# Patient Record
Sex: Male | Born: 1975 | Race: White | Hispanic: No | Marital: Single | Smoking: Never smoker
Health system: Southern US, Community
[De-identification: ages and names within clinical notes are randomized; demographics above are authoritative.]

## PROBLEM LIST (undated history)

## (undated) DIAGNOSIS — F1021 Alcohol dependence, in remission: Secondary | ICD-10-CM

## (undated) DIAGNOSIS — A498 Other bacterial infections of unspecified site: Secondary | ICD-10-CM

## (undated) DIAGNOSIS — K219 Gastro-esophageal reflux disease without esophagitis: Secondary | ICD-10-CM

## (undated) DIAGNOSIS — I10 Essential (primary) hypertension: Secondary | ICD-10-CM

## (undated) DIAGNOSIS — K644 Residual hemorrhoidal skin tags: Secondary | ICD-10-CM

## (undated) DIAGNOSIS — K769 Liver disease, unspecified: Secondary | ICD-10-CM

## (undated) DIAGNOSIS — E785 Hyperlipidemia, unspecified: Secondary | ICD-10-CM

## (undated) DIAGNOSIS — K449 Diaphragmatic hernia without obstruction or gangrene: Secondary | ICD-10-CM

## (undated) DIAGNOSIS — K760 Fatty (change of) liver, not elsewhere classified: Secondary | ICD-10-CM

## (undated) DIAGNOSIS — K2270 Barrett's esophagus without dysplasia: Secondary | ICD-10-CM

## (undated) DIAGNOSIS — K5730 Diverticulosis of large intestine without perforation or abscess without bleeding: Secondary | ICD-10-CM

## (undated) HISTORY — DX: Barrett's esophagus without dysplasia: K22.70

## (undated) HISTORY — DX: Diaphragmatic hernia without obstruction or gangrene: K44.9

## (undated) HISTORY — DX: Gastro-esophageal reflux disease without esophagitis: K21.9

## (undated) HISTORY — DX: Diverticulosis of large intestine without perforation or abscess without bleeding: K57.30

## (undated) HISTORY — DX: Fatty (change of) liver, not elsewhere classified: K76.0

## (undated) HISTORY — DX: Residual hemorrhoidal skin tags: K64.4

---

## 2005-01-29 ENCOUNTER — Ambulatory Visit: Payer: Self-pay | Admitting: Gastroenterology

## 2006-01-06 ENCOUNTER — Encounter: Admission: RE | Admit: 2006-01-06 | Discharge: 2006-01-06 | Payer: Self-pay | Admitting: Family Medicine

## 2007-08-23 ENCOUNTER — Ambulatory Visit: Payer: Self-pay | Admitting: Gastroenterology

## 2007-08-23 DIAGNOSIS — K7689 Other specified diseases of liver: Secondary | ICD-10-CM

## 2007-08-23 DIAGNOSIS — E669 Obesity, unspecified: Secondary | ICD-10-CM | POA: Insufficient documentation

## 2007-08-23 DIAGNOSIS — K2270 Barrett's esophagus without dysplasia: Secondary | ICD-10-CM

## 2007-08-23 DIAGNOSIS — K219 Gastro-esophageal reflux disease without esophagitis: Secondary | ICD-10-CM | POA: Insufficient documentation

## 2008-10-29 ENCOUNTER — Telehealth: Payer: Self-pay | Admitting: Gastroenterology

## 2010-08-14 ENCOUNTER — Ambulatory Visit: Payer: Self-pay | Admitting: Gastroenterology

## 2014-02-23 ENCOUNTER — Other Ambulatory Visit (HOSPITAL_BASED_OUTPATIENT_CLINIC_OR_DEPARTMENT_OTHER): Payer: Self-pay | Admitting: Osteopathic Medicine

## 2014-02-23 DIAGNOSIS — R51 Headache: Principal | ICD-10-CM

## 2014-02-23 DIAGNOSIS — R519 Headache, unspecified: Secondary | ICD-10-CM

## 2014-02-23 DIAGNOSIS — H579 Unspecified disorder of eye and adnexa: Secondary | ICD-10-CM

## 2014-02-24 ENCOUNTER — Ambulatory Visit (HOSPITAL_BASED_OUTPATIENT_CLINIC_OR_DEPARTMENT_OTHER): Payer: 59

## 2014-11-02 ENCOUNTER — Encounter: Payer: Self-pay | Admitting: Gastroenterology

## 2015-07-31 ENCOUNTER — Emergency Department (HOSPITAL_BASED_OUTPATIENT_CLINIC_OR_DEPARTMENT_OTHER)
Admission: EM | Admit: 2015-07-31 | Discharge: 2015-07-31 | Disposition: A | Discharge status: Home / Self Care | Payer: 59 | Attending: Emergency Medicine | Admitting: Emergency Medicine

## 2015-07-31 ENCOUNTER — Emergency Department (HOSPITAL_BASED_OUTPATIENT_CLINIC_OR_DEPARTMENT_OTHER): Payer: 59

## 2015-07-31 ENCOUNTER — Encounter (HOSPITAL_BASED_OUTPATIENT_CLINIC_OR_DEPARTMENT_OTHER): Payer: Self-pay | Admitting: *Deleted

## 2015-07-31 DIAGNOSIS — R079 Chest pain, unspecified: Secondary | ICD-10-CM | POA: Diagnosis not present

## 2015-07-31 DIAGNOSIS — R202 Paresthesia of skin: Secondary | ICD-10-CM | POA: Insufficient documentation

## 2015-07-31 DIAGNOSIS — R0602 Shortness of breath: Secondary | ICD-10-CM | POA: Diagnosis not present

## 2015-07-31 DIAGNOSIS — R160 Hepatomegaly, not elsewhere classified: Secondary | ICD-10-CM | POA: Insufficient documentation

## 2015-07-31 DIAGNOSIS — R251 Tremor, unspecified: Secondary | ICD-10-CM | POA: Insufficient documentation

## 2015-07-31 DIAGNOSIS — R002 Palpitations: Secondary | ICD-10-CM | POA: Diagnosis not present

## 2015-07-31 DIAGNOSIS — K460 Unspecified abdominal hernia with obstruction, without gangrene: Secondary | ICD-10-CM | POA: Insufficient documentation

## 2015-07-31 DIAGNOSIS — R112 Nausea with vomiting, unspecified: Secondary | ICD-10-CM | POA: Diagnosis present

## 2015-07-31 DIAGNOSIS — R000 Tachycardia, unspecified: Secondary | ICD-10-CM | POA: Diagnosis not present

## 2015-07-31 DIAGNOSIS — R1111 Vomiting without nausea: Secondary | ICD-10-CM

## 2015-07-31 LAB — COMPREHENSIVE METABOLIC PANEL
ALBUMIN: 3.5 g/dL (ref 3.5–5.0)
ALK PHOS: 98 U/L (ref 38–126)
ALT: 39 U/L (ref 17–63)
ANION GAP: 14 (ref 5–15)
AST: 156 U/L — ABNORMAL HIGH (ref 15–41)
BUN: 8 mg/dL (ref 6–20)
CALCIUM: 8.1 mg/dL — AB (ref 8.9–10.3)
CHLORIDE: 98 mmol/L — AB (ref 101–111)
CO2: 26 mmol/L (ref 22–32)
Creatinine, Ser: 0.78 mg/dL (ref 0.61–1.24)
GFR calc non Af Amer: >60 mL/min (ref >60)
GLUCOSE: 130 mg/dL — AB (ref 65–99)
POTASSIUM: 3.6 mmol/L (ref 3.5–5.1)
SODIUM: 138 mmol/L (ref 135–145)
Total Bilirubin: 4 mg/dL — ABNORMAL HIGH (ref 0.3–1.2)
Total Protein: 9.3 g/dL — ABNORMAL HIGH (ref 6.5–8.1)

## 2015-07-31 LAB — CBC WITH DIFFERENTIAL/PLATELET
BASOS PCT: 0 %
Basophils Absolute: 0 10*3/uL (ref 0.0–0.1)
EOS ABS: 0 10*3/uL (ref 0.0–0.7)
EOS PCT: 0 %
HCT: 40.4 % (ref 39.0–52.0)
HEMOGLOBIN: 13.8 g/dL (ref 13.0–17.0)
LYMPHS ABS: 0.4 10*3/uL — AB (ref 0.7–4.0)
Lymphocytes Relative: 9 %
MCH: 33 pg (ref 26.0–34.0)
MCHC: 34.2 g/dL (ref 30.0–36.0)
MCV: 96.7 fL (ref 78.0–100.0)
MONOS PCT: 5 %
Monocytes Absolute: 0.2 10*3/uL (ref 0.1–1.0)
NEUTROS PCT: 86 %
Neutro Abs: 3.9 10*3/uL (ref 1.7–7.7)
PLATELETS: 97 10*3/uL — AB (ref 150–400)
RBC: 4.18 MIL/uL — ABNORMAL LOW (ref 4.22–5.81)
RDW: 12.6 % (ref 11.5–15.5)
WBC: 4.5 10*3/uL (ref 4.0–10.5)

## 2015-07-31 LAB — MAGNESIUM: MAGNESIUM: 0.9 mg/dL — AB (ref 1.7–2.4)

## 2015-07-31 LAB — TROPONIN I: Troponin I: <0.03 ng/mL (ref <0.031)

## 2015-07-31 MED ORDER — SODIUM CHLORIDE 0.9 % IV BOLUS (SEPSIS)
1000.0000 mL | Freq: Once | INTRAVENOUS | Status: AC
  Administered 2015-07-31: 1000 mL via INTRAVENOUS

## 2015-07-31 MED ORDER — MAGNESIUM OXIDE -MG SUPPLEMENT 500 MG PO TABS: 500.0000 mg | ORAL_TABLET | Freq: Two times a day (BID) | ORAL | Status: DC

## 2015-07-31 MED ORDER — LORAZEPAM 2 MG/ML IJ SOLN
1.0000 mg | Freq: Once | INTRAMUSCULAR | Status: AC
  Administered 2015-07-31: 1 mg via INTRAVENOUS
  Filled 2015-07-31: qty 1

## 2015-07-31 MED ORDER — IOPAMIDOL (ISOVUE-300) INJECTION 61%
100.0000 mL | Freq: Once | INTRAVENOUS | Status: AC | PRN
  Administered 2015-07-31: 100 mL via INTRAVENOUS

## 2015-07-31 MED ORDER — ONDANSETRON HCL 4 MG/2ML IJ SOLN
4.0000 mg | Freq: Once | INTRAMUSCULAR | Status: AC
  Administered 2015-07-31: 4 mg via INTRAVENOUS
  Filled 2015-07-31: qty 2

## 2015-07-31 MED ORDER — LORAZEPAM 2 MG/ML IJ SOLN
2.0000 mg | Freq: Once | INTRAMUSCULAR | Status: AC
  Administered 2015-07-31: 2 mg via INTRAVENOUS
  Filled 2015-07-31: qty 1

## 2015-07-31 MED ORDER — CHLORDIAZEPOXIDE HCL 25 MG PO CAPS
100.0000 mg | ORAL_CAPSULE | Freq: Once | ORAL | Status: AC
  Administered 2015-07-31: 100 mg via ORAL
  Filled 2015-07-31: qty 4

## 2015-07-31 MED ORDER — ONDANSETRON 4 MG PO TBDP: ORAL_TABLET | ORAL | Status: DC

## 2015-07-31 MED ORDER — MORPHINE SULFATE (PF) 4 MG/ML IV SOLN: 4.0000 mg | Freq: Once | INTRAVENOUS | Status: DC

## 2015-07-31 MED ORDER — CHLORDIAZEPOXIDE HCL 25 MG PO CAPS: ORAL_CAPSULE | ORAL | Status: DC

## 2015-07-31 MED ORDER — MAGNESIUM SULFATE 2 GM/50ML IV SOLN
2.0000 g | Freq: Once | INTRAVENOUS | Status: AC
  Administered 2015-07-31: 2 g via INTRAVENOUS
  Filled 2015-07-31: qty 50

## 2015-07-31 MED ORDER — VITAMIN B-1 100 MG PO TABS
100.0000 mg | ORAL_TABLET | Freq: Once | ORAL | Status: AC
  Administered 2015-07-31: 100 mg via ORAL
  Filled 2015-07-31: qty 1

## 2015-07-31 NOTE — Discharge Instructions (Signed)
Hypomagnesemia °Hypomagnesemia is a condition in which the level of magnesium in the blood is low. Magnesium is a mineral that is found in many foods. It is used in many different processes in the body. Hypomagnesemia can affect every organ in the body. It can cause life-threatening problems. °CAUSES °Causes of hypomagnesemia include: °· Not getting enough magnesium in your diet. °· Malnutrition. °· Problems with absorbing magnesium from the intestines. °· Dehydration. °· Alcohol abuse. °· Vomiting. °· Severe diarrhea. °· Some medicines, including medicines that make you urinate more. °· Certain diseases, such as kidney disease, diabetes, and overactive thyroid. °SIGNS AND SYMPTOMS °· Involuntary shaking or trembling of a body part (tremor). °· Confusion. °· Muscle weakness. °· Sensitivity to light, sound, and touch. °· Psychiatric issues, such as depression, irritability, or psychosis. °· Sudden tightening of muscles (muscle spasms). °· Tingling in the arms and legs. °· A feeling of fluttering of the heart. °These symptoms are more severe if magnesium levels drop suddenly. °DIAGNOSIS °To make a diagnosis, your health care provider will do a physical exam and order blood and urine tests. °TREATMENT °Treatment will depend on the cause and the severity of your condition. It may involve: °· A magnesium supplement. This can be taken in pill form. It can also be given through an IV tube. This is usually done if the condition is severe. °· Changes to your diet. You may be directed to eat foods that have a lot of magnesium, such as green leafy vegetables, peas, beans, and nuts. °· Eliminating alcohol from your diet. °HOME CARE INSTRUCTIONS °· Include foods with magnesium in your diet. Foods that are rich in magnesium include green vegetables, beans, nuts and seeds, and whole grains. °· Take medicines only as directed by your health care provider. °· Take magnesium supplements if your health care provider instructs you to  do that. Take them as directed. °· Have your magnesium levels monitored as directed by your health care provider. °· When you are active, drink fluids that contain electrolytes. °· Keep all follow-up visits as directed by your health care provider. This is important. °SEEK MEDICAL CARE IF: °· You get worse instead of better. °· Your symptoms return. °SEEK IMMEDIATE MEDICAL CARE IF: °· Your symptoms are severe. °  °This information is not intended to replace advice given to you by your health care provider. Make sure you discuss any questions you have with your health care provider. °  °Document Released: 12/31/2004 Document Revised: 04/27/2014 Document Reviewed: 11/20/2013 °Elsevier Interactive Patient Education ©2016 [employer] ° °

## 2015-07-31 NOTE — ED Notes (Signed)
Pt reports he is an alocholic dx with fatty liver and tried to cut back on drinking but reports he used to drink 6-7 shots a day now back to about 3-4.  Reports 3-4 shots last night which was last drink. COmplains of n/v and tremors.  Pt is diaphoretic.  Expresses nervousness.

## 2015-07-31 NOTE — ED Notes (Signed)
Pt reports 3 days of vomiting, tremors.  Denies pain.

## 2015-07-31 NOTE — ED Notes (Signed)
Finished contrast.  Appears to feel a little better.  Less tremors noted.

## 2015-07-31 NOTE — ED Notes (Addendum)
Pt went to PCP PTA, was given lopressor for HTN-pt reports that he vomited the pill up.  Pt appears to be in withdrawal from ETOH-generally drinks multiple drinks of liquor daily-reports trying 'to cut back recently'.  Pt diaphoretic in triage.  Reports hallucinations (visual and auditory).  Denies SI, HI.  Pt calm and cooperative.

## 2015-07-31 NOTE — ED Provider Notes (Signed)
CSN: [id number]     Arrival date & time 07/31/15  1832 History  By signing my name below, I, Linna Darner, attest that this documentation has been prepared under the direction and in the presence of physician practitioner, Melene Plan, DO. Electronically Signed: Linna Darner, Scribe. 07/31/2015. 6:54 PM.   Chief Complaint  Patient presents with  . Emesis    The history is provided by the patient. No language interpreter was used.     HPI Comments: Jackson Fisher is a 40 y.o. male with h/o Barrett's esophagus, fatty liver, and esophageal reflux who presents to the Emergency Department complaining of sudden onset emesis beginning three days ago. Pt reports experiencing palpitations, tingling in his bilateral arms and legs, and SOB beginning earlier today. He also notes associated tremors and weakness beginning a couple of days ago; pt does not experience tremors at baseline. He endorses associated chest pain due to his palpitations that radiates into his bilateral shoulders and bilateral legs. Pt reports that he is an alcoholic and had 3-4 shots of liquor last night but was not drunk; he has been trying to "cut back" on ETOH intake recently. He states that he went to his PCP today after he began to experience palpitations and SOB at work earlier today. Pt notes that his PCP gave him Lopressor for HTN and he regurgitated the pill. Pt is unable to retain food or fluids due to nausea and emesis. Pt has an abdominal hernia. He denies diarrhea or any other associated symptoms.  Past Medical History  Diagnosis Date  . Diverticulosis of colon (without mention of hemorrhage)   . External hemorrhoids without mention of complication   . Barrett's esophagus   . Hiatal hernia   . Esophageal reflux   . Fatty liver    History reviewed. No pertinent past surgical history. Family History  Problem Relation Age of Onset  . Diabetes Father   . Breast cancer Mother    Social History  Substance Use Topics   . Smoking status: Never Smoker   . Smokeless tobacco: None  . Alcohol Use: Yes    Review of Systems  Constitutional: Negative for fever and chills.  HENT: Negative for congestion and facial swelling.   Eyes: Negative for discharge and visual disturbance.  Respiratory: Positive for shortness of breath.   Cardiovascular: Positive for chest pain and palpitations.  Gastrointestinal: Positive for nausea and vomiting. Negative for abdominal pain and diarrhea.  Musculoskeletal: Positive for arthralgias. Negative for myalgias.  Skin: Negative for color change and rash.  Neurological: Positive for tremors and weakness. Negative for syncope and headaches.  Psychiatric/Behavioral: Negative for confusion and dysphoric mood.    Allergies  Review of patient's allergies indicates no known allergies.  Home Medications   Prior to Admission medications   Medication Sig Start Date End Date Taking? Authorizing Provider  chlordiazePOXIDE (LIBRIUM) 25 MG capsule  PO TID x 1D, then 25-50mg  PO BID X 1D, then 25-50mg  PO QD X 1D 07/31/15   Melene Plan, DO  Magnesium Oxide 500 MG TABS Take 1 tablet (500 mg total) by mouth 2 (two) times daily. 07/31/15   Melene Plan, DO  ondansetron (ZOFRAN ODT) 4 MG disintegrating tablet  ODT q4 hours prn nausea/vomit 07/31/15   Melene Plan, DO   BP 125/89 mmHg  Pulse 81  Temp(Src) 98.7 F (37.1 C) (Oral)  Resp 22  Ht 6' (1.829 m)  Wt 250 lb (113.399 kg)  BMI 33.90 kg/m2  SpO2 91% Physical Exam  Constitutional: He is oriented to person, place, and time. He appears well-developed and well-nourished.  Tremulus   HENT:  Head: Normocephalic and atraumatic.  Eyes: EOM are normal. Pupils are equal, round, and reactive to light.  Neck: Normal range of motion. Neck supple. No JVD present.  Cardiovascular: Regular rhythm.  Tachycardia present.  Exam reveals no gallop and no friction rub.   No murmur heard. Pulmonary/Chest: No respiratory distress. He has no wheezes.   Abdominal: He exhibits no distension. There is no rebound and no guarding.  Hepatomegaly, three fingers below costal margin Very mild epigastric tenderness Negative Murphy's Sign  Musculoskeletal: Normal range of motion.  Neurological: He is alert and oriented to person, place, and time.  Skin: No rash noted. No pallor.  Psychiatric: He has a normal mood and affect. His behavior is normal.  Nursing note and vitals reviewed.   ED Course  Procedures (including critical care time)  DIAGNOSTIC STUDIES: Oxygen Saturation is 97% on RA, normal by my interpretation.    COORDINATION OF CARE: 6:54 PM Discussed treatment plan with pt at bedside and pt agreed to plan.  Labs Review Labs Reviewed  CBC WITH DIFFERENTIAL/PLATELET - Abnormal; Notable for the following:    RBC 4.18 (*)    Platelets 97 (*)    Lymphs Abs 0.4 (*)    All other components within normal limits  COMPREHENSIVE METABOLIC PANEL - Abnormal; Notable for the following:    Chloride 98 (*)    Glucose, Bld 130 (*)    Calcium 8.1 (*)    Total Protein 9.3 (*)    AST 156 (*)    Total Bilirubin 4.0 (*)    All other components within normal limits  MAGNESIUM - Abnormal; Notable for the following:    Magnesium 0.9 (*)    All other components within normal limits  TROPONIN I    Imaging Review Dg Chest 2 View  07/31/2015  CLINICAL DATA:  Chest pain. EXAM: CHEST  2 VIEW COMPARISON:  None. FINDINGS: The heart size and mediastinal contours are within normal limits. Both lungs are clear. No pneumothorax or pleural effusion is noted. The visualized skeletal structures are unremarkable. IMPRESSION: No active cardiopulmonary disease. Electronically Signed   By: Lupita RaiderJames  Green Jr, M.D.   On: 07/31/2015 20:09   Ct Abdomen Pelvis W Contrast  07/31/2015  CLINICAL DATA:  Epigastric abdominal pain, nausea, vomiting. EXAM: CT ABDOMEN AND PELVIS WITH CONTRAST TECHNIQUE: Multidetector CT imaging of the abdomen and pelvis was performed using the  standard protocol following bolus administration of intravenous contrast. CONTRAST:  100mL ISOVUE-300 IOPAMIDOL (ISOVUE-300) INJECTION 61% COMPARISON:  CT scan of August 03, 2009. FINDINGS: Mild multilevel degenerative disc disease is noted in the lumbar spine. Visualized lung bases are unremarkable. No gallstones are noted. Fatty infiltration of the liver is noted. Mild splenomegaly is noted. Pancreas appears normal. Adrenal glands and kidneys appear normal. No hydronephrosis or renal obstruction is noted. No renal or ureteral calculi are noted. The appendix appears normal. There is no evidence of bowel obstruction. Sigmoid diverticulosis is noted without inflammation. Urinary bladder appears normal. No significant adenopathy is noted. IMPRESSION: Fatty infiltration of the liver. Mild splenomegaly. Sigmoid diverticulosis without inflammation. No acute abnormality seen in the abdomen or pelvis. Electronically Signed   By: Lupita RaiderJames  Green Jr, M.D.   On: 07/31/2015 21:55   I have personally reviewed and evaluated these images and lab results as part of my medical decision-making.   EKG Interpretation None  ED ECG REPORT   Date: 07/31/2015  Rate: 88  Rhythm: normal sinus rhythm  QRS Axis: normal  Intervals: normal  ST/T Wave abnormalities: normal  Conduction Disutrbances:none  Narrative Interpretation:   Old EKG Reviewed: none available  I have personally reviewed the EKG tracing and agree with the computerized printout as noted.   MDM   Final diagnoses:  Non-intractable vomiting without nausea, vomiting of unspecified type  Hypomagnesemia    40 yo M With a chief complaint of nausea and vomiting. Patient's initial presentation concerning for alcohol withdrawal. Hypertensive tachycardic having tremors. Will give the patient a liter bolus as well as treatment with Ativan and Librium. Laboratory evaluation for possible intra-abdominal pathology. Patient having very mild abdominal  tenderness. See no reason for imaging at this time. On review of systems patient complaining of chest pain. States is been going on for some time. Denies anything makes it better or worse. Does not appear to be exertional.    Trop negative, feel no need to repeat. Feeling much better post mag administration. Will start on supplementation.  D/c home.   I personally performed the services described in this documentation, which was scribed in my presence. The recorded information has been reviewed and is accurate.   10:35 PM:  I have discussed the diagnosis/risks/treatment options with the patient and family and believe the pt to be eligible for discharge home to follow-up with PCP. We also discussed returning to the ED immediately if new or worsening sx occur. We discussed the sx which are most concerning (e.g., sudden worsening pain, fever, inability to tolerate by mouth) that necessitate immediate return. Medications administered to the patient during their visit and any new prescriptions provided to the patient are listed below.  Medications given during this visit Medications  morphine 4 MG/ML injection 4 mg (not administered)  sodium chloride 0.9 % bolus 1,000 mL (0 mLs Intravenous Stopped 07/31/15 2037)  ondansetron (ZOFRAN) injection 4 mg (4 mg Intravenous Given 07/31/15 1948)  LORazepam (ATIVAN) injection 1 mg (1 mg Intravenous Given 07/31/15 1948)  chlordiazePOXIDE (LIBRIUM) capsule 100 mg (100 mg Oral Given 07/31/15 1948)  LORazepam (ATIVAN) injection 2 mg (2 mg Intravenous Given 07/31/15 2037)  thiamine (VITAMIN B-1) tablet 100 mg (100 mg Oral Given 07/31/15 2037)  magnesium sulfate IVPB 2 g 50 mL (0 g Intravenous Stopped 07/31/15 2113)  iopamidol (ISOVUE-300) 61 % injection 100 mL (100 mLs Intravenous Contrast Given 07/31/15 2137)    New Prescriptions   CHLORDIAZEPOXIDE (LIBRIUM) 25 MG CAPSULE     PO TID x 1D, then 25-50mg  PO BID X 1D, then 25-50mg  PO QD X 1D   MAGNESIUM OXIDE 500 MG  TABS    Take 1 tablet (500 mg total) by mouth 2 (two) times daily.   ONDANSETRON (ZOFRAN ODT) 4 MG DISINTEGRATING TABLET     ODT q4 hours prn nausea/vomit    The patient appears reasonably screen and/or stabilized for discharge and I doubt any other medical condition or other [facility] requiring further screening, evaluation, or treatment in the ED at this time prior to discharge.    Melene Plan, DO 07/31/15 2236

## 2015-12-02 ENCOUNTER — Encounter: Payer: Self-pay | Admitting: *Deleted

## 2015-12-02 ENCOUNTER — Emergency Department
Admission: EM | Admit: 2015-12-02 | Discharge: 2015-12-02 | Disposition: A | Discharge status: Home / Self Care | Payer: 59 | Source: Home / Self Care | Attending: Family Medicine | Admitting: Family Medicine

## 2015-12-02 DIAGNOSIS — M7021 Olecranon bursitis, right elbow: Secondary | ICD-10-CM

## 2015-12-02 HISTORY — DX: Other bacterial infections of unspecified site: A49.8

## 2015-12-02 HISTORY — DX: Hyperlipidemia, unspecified: E78.5

## 2015-12-02 HISTORY — DX: Liver disease, unspecified: K76.9

## 2015-12-02 HISTORY — DX: Essential (primary) hypertension: I10

## 2015-12-02 HISTORY — DX: Gastro-esophageal reflux disease without esophagitis: K21.9

## 2015-12-02 MED ORDER — CLINDAMYCIN HCL 300 MG PO CAPS: 300.0000 mg | ORAL_CAPSULE | Freq: Four times a day (QID) | ORAL | 0 refills | Status: DC

## 2015-12-02 NOTE — ED Triage Notes (Signed)
Pt c/o RT elbow swelling, pain and redness x 11/29/2015. Denies injury. He reports blood draw in that arm 11/27/15 at his GI specialist. He took Tramadol at 0200 and 0900 today.

## 2015-12-02 NOTE — Discharge Instructions (Signed)
°  Please take antibiotics as prescribed and be sure to complete entire course even if you start to feel better to ensure infection does not come back. ° °

## 2015-12-02 NOTE — ED Provider Notes (Signed)
CSN: [id number]     Arrival date & time 12/02/15  1043 History   First MD Initiated Contact with Patient 12/02/15 1059     Chief Complaint  Patient presents with  . Joint Swelling   (Consider location/radiation/quality/duration/timing/severity/associated sxs/prior Treatment) HPI  Jackson Fisher is a 40 y.o. male presenting to UC with c/o gradually worsening Right elbow pain, redness, and swelling that started on 11/29/15 but notes he had labs drawn at his GI specialist office on 11/27/15. Pain is aching and throbbing 7/10, makes it difficult to sleep.  He took tramadol at 0200 and 0900 this morning with minimal relief. Pt unsure if that is what caused current symptoms. No hx of same. Denies known injury. He reports being hospitalized for over a week recently for liver failure and has f/u with GI on Thursday, 8/17.  Denies fever, chills, n/v/d.   Past Medical History:  Diagnosis Date  . Barrett's esophagus   . Clostridium difficile infection   . Diverticulosis of colon (without mention of hemorrhage)   . Esophageal reflux   . External hemorrhoids without mention of complication   . Fatty liver   . GERD (gastroesophageal reflux disease)   . Hiatal hernia   . Hyperlipidemia   . Hypertension   . Liver disease    History reviewed. No pertinent surgical history. Family History  Problem Relation Age of Onset  . Diabetes Father   . Breast cancer Mother    Social History  Substance Use Topics  . Smoking status: Never Smoker  . Smokeless tobacco: Never Used  . Alcohol use No     Comment: Quit 10/29/15    Review of Systems  Constitutional: Negative for chills, fatigue and fever.  Musculoskeletal: Positive for arthralgias and joint swelling. Negative for myalgias.       Right elbow  Skin: Positive for color change. Negative for rash and wound.  Neurological: Negative for weakness and numbness.    Allergies  Review of patient's allergies indicates no known allergies.  Home  Medications   Prior to Admission medications   Medication Sig Start Date End Date Taking? Authorizing Provider  cloNIDine (CATAPRES) 0.1 MG tablet Take 0.1 mg by mouth 2 (two) times daily.   Yes Historical Provider, MD  lansoprazole (PREVACID) 30 MG capsule Take 30 mg by mouth daily at 12 noon.   Yes Historical Provider, MD  pyridOXINE (VITAMIN B-6) 100 MG tablet Take 100 mg by mouth daily.   Yes Historical Provider, MD  thiamine (VITAMIN B-1) 100 MG tablet Take 100 mg by mouth daily.   Yes Historical Provider, MD  traMADol (ULTRAM) 50 MG tablet Take by mouth every 6 (six) hours as needed.   Yes Historical Provider, MD  clindamycin (CLEOCIN) 300 MG capsule Take 1 capsule (300 mg total) by mouth 4 (four) times daily. X 7 days 12/02/15   Junius FinnerErin O'Malley, PA-C   Meds Ordered and Administered this Visit  Medications - No data to display  BP 103/57 (BP Location: Left Arm)   Pulse 67   Temp 98.2 F (36.8 C) (Oral)   Resp 16   SpO2 93%  No data found.   Physical Exam  Constitutional: He is oriented to person, place, and time. He appears well-developed and well-nourished.  HENT:  Head: Normocephalic and atraumatic.  Eyes: EOM are normal.  Neck: Normal range of motion.  Cardiovascular: Normal rate.   Pulmonary/Chest: Effort normal.  Musculoskeletal: Normal range of motion. He exhibits edema and tenderness.  Right elbow: He exhibits swelling. He exhibits normal range of motion. Tenderness found. Olecranon process tenderness noted.  Neurological: He is alert and oriented to person, place, and time.  Skin: Skin is warm and dry. There is erythema.  Right elbow: skin in tact, 3cm area of erythema and warmth over olecranon bursa.   Psychiatric: He has a normal mood and affect. His behavior is normal.  Nursing note and vitals reviewed.   Urgent Care Course   Clinical Course    Procedures (including critical care time)  Labs Review Labs Reviewed - No data to display  Imaging  Review No results found.    MDM   1. Olecranon bursitis, right    Pt c/o worsening pain, redness, swelling and warmth of Right elbow.  Exam c/w olecranon bursitis w/o evidence of septic joint.  Rx: Clindamycin. Home care instructions provided. Encouraged f/u in 3-4 days if not improving, sooner if worsening. Patient verbalized understanding and agreement with treatment plan.     Junius Finnerrin O'Malley, PA-C 12/02/15 1131

## 2016-05-28 ENCOUNTER — Encounter: Payer: Self-pay | Admitting: Emergency Medicine

## 2016-05-28 ENCOUNTER — Emergency Department
Admission: EM | Admit: 2016-05-28 | Discharge: 2016-05-28 | Disposition: A | Discharge status: Home / Self Care | Payer: 59 | Source: Home / Self Care | Attending: Family Medicine | Admitting: Family Medicine

## 2016-05-28 DIAGNOSIS — S61411A Laceration without foreign body of right hand, initial encounter: Secondary | ICD-10-CM | POA: Diagnosis not present

## 2016-05-28 DIAGNOSIS — Z23 Encounter for immunization: Secondary | ICD-10-CM | POA: Diagnosis not present

## 2016-05-28 HISTORY — DX: Alcohol dependence, in remission: F10.21

## 2016-05-28 MED ORDER — TETANUS-DIPHTH-ACELL PERTUSSIS 5-2.5-18.5 LF-MCG/0.5 IM SUSP
0.5000 mL | Freq: Once | INTRAMUSCULAR | Status: AC
  Administered 2016-05-28: 0.5 mL via INTRAMUSCULAR

## 2016-05-28 NOTE — Discharge Instructions (Addendum)
Change dressing daily and apply Bacitracin ointment to wound.  Keep wound clean and dry.  Return for any signs of infection (or follow-up with family doctor):  Increasing redness, swelling, pain, heat, drainage, etc. °Return in 10 days for suture removal.   °

## 2016-05-28 NOTE — ED Triage Notes (Signed)
Patient cut knuckle of right index finger on metal piece in sofa around 1100. Unknown tetanus status. No pain.

## 2016-05-28 NOTE — ED Provider Notes (Signed)
[facility]    CSN: [id number] Arrival date & time: 05/28/16  1825     History   Chief Complaint Chief Complaint  Patient presents with  . Hand Injury    right knuckle index    HPI Jackson Fisher is a 41 y.o. male.   Approximately 7 hours ago patient lacerated his right hand on a sharp piece of metal on his couch.  He does not recall his last Tdap   The history is provided by the patient.  Laceration  Location:  Hand Hand laceration location:  Dorsum of R hand Length:  1 cm Depth:  Through dermis Quality: straight   Bleeding: controlled   Time since incident:  11 hours Laceration mechanism:  Metal edge Pain details:    Quality:  Dull   Severity:  Mild   Timing:  Constant   Progression:  Improving Foreign body present:  No foreign bodies Relieved by:  None tried Worsened by:  Movement Ineffective treatments:  None tried Tetanus status:  Out of date Associated symptoms: no numbness and no swelling     Past Medical History:  Diagnosis Date  . Barrett's esophagus   . Clostridium difficile infection   . Diverticulosis of colon (without mention of hemorrhage)   . Esophageal reflux   . External hemorrhoids without mention of complication   . Fatty liver   . GERD (gastroesophageal reflux disease)   . Hiatal hernia   . Hyperlipidemia   . Hypertension   . Liver disease   . 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 OBESITY 08/23/2007  . GERD 08/23/2007  . BARRETT'S ESOPHAGUS 08/23/2007  . FATTY LIVER DISEASE 08/23/2007    History reviewed. No pertinent surgical history.     Home Medications    Prior to Admission medications   Medication Sig Start Date End Date Taking? Authorizing Provider  cloNIDine (CATAPRES) 0.1 MG tablet Take 0.1 mg by mouth 2 (two) times daily.    Historical Provider, MD  lansoprazole (PREVACID) 30 MG capsule Take 30 mg by mouth daily at 12 noon.    Historical  Provider, MD  pyridOXINE (VITAMIN B-6) 100 MG tablet Take 100 mg by mouth daily.    Historical Provider, MD  thiamine (VITAMIN B-1) 100 MG tablet Take 100 mg by mouth daily.    Historical Provider, MD  traMADol (ULTRAM) 50 MG tablet Take by mouth every 6 (six) hours as needed.    Historical Provider, MD    Family History Family History  Problem Relation Age of Onset  . Diabetes Father   . Breast cancer Mother     Social History Social History  Substance Use Topics  . Smoking status: Never Smoker  . Smokeless tobacco: Never Used  . Alcohol use No     Comment: Quit 10/29/15     Allergies   Patient has no known allergies.   Review of Systems Review of Systems  All other systems reviewed and are negative.    Physical Exam Triage Vital Signs ED Triage Vitals  Enc Vitals Group     BP 05/28/16 1852 112/67     Pulse Rate 05/28/16 1852 62     Resp 05/28/16 1852 16     Temp 05/28/16 1852 98.1 F (36.7 C)     Temp Source 05/28/16 1852 Oral     SpO2 05/28/16 1852 99 %     Weight 05/28/16 1853 215  lb (97.5 kg)     Height 05/28/16 1853 6\' 1"  (1.854 m)     Head Circumference --      Peak Flow --      Pain Score 05/28/16 1855 0     Pain Loc --      Pain Edu? --      Excl. in GC? --    No data found.   Updated Vital Signs BP 112/67 (BP Location: Left Arm)   Pulse 62   Temp 98.1 F (36.7 C) (Oral)   Resp 16   Ht 6\' 1"  (1.854 m)   Wt 215 lb (97.5 kg)   SpO2 99%   BMI 28.37 kg/m   Visual Acuity Right Eye Distance:   Left Eye Distance:   Bilateral Distance:    Right Eye Near:   Left Eye Near:    Bilateral Near:     Physical Exam  Constitutional: He appears well-developed and well-nourished. No distress.  HENT:  Head: Atraumatic.  Eyes: Pupils are equal, round, and reactive to light.  Cardiovascular: Normal rate.   Pulmonary/Chest: Effort normal.  Musculoskeletal:       Hands: Dorsally over the 4th MCP joint is a simple superficial 1cm long laceration.    Neurological: He is alert.  Skin: Skin is warm and dry.  Nursing note and vitals reviewed.    UC Treatments / Results  Labs (all labs ordered are listed, but only abnormal results are displayed) Labs Reviewed - No data to display  EKG  EKG Interpretation None       Radiology No results found.  Procedures Procedures Laceration Repair Discussed benefits and risks of procedure and verbal consent obtained. Using sterile technique and local anesthesia with 1% lidocaine without epinephrine, cleansed wound with Betadine followed by copious lavage with normal saline.  Wound carefully inspected for debris and foreign bodies; none found.  Wound closed with #3, 5-0 interrupted Prolene sutures.  Bacitracin and non-stick sterile dressing applied.  Wound precautions explained to patient.  Return for suture removal in 10 days.      Medications Ordered in UC Medications  Tdap (BOOSTRIX) injection 0.5 mL (0.5 mLs Intramuscular Given 05/28/16 1858)     Initial Impression / Assessment and Plan / UC Course  I have reviewed the triage vital signs and the nursing notes.  Pertinent labs & imaging results that were available during my care of the patient were reviewed by me and considered in my medical decision making (see chart for details).    Administered Tdap  Change dressing daily and apply Bacitracin ointment to wound.  Keep wound clean and dry.  Return for any signs of infection (or follow-up with family doctor):  Increasing redness, swelling, pain, heat, drainage, etc. Return in 10 days for suture removal.     Final Clinical Impressions(s) / UC Diagnoses   Final diagnoses:  Laceration of right hand without foreign body, initial encounter    New Prescriptions Current Discharge Medication List       Lattie HawStephen A Beese, MD 05/29/16 2107

## 2016-08-18 ENCOUNTER — Emergency Department
Admission: EM | Admit: 2016-08-18 | Discharge: 2016-08-18 | Disposition: A | Discharge status: Home / Self Care | Payer: 59 | Source: Home / Self Care | Attending: Family Medicine | Admitting: Family Medicine

## 2016-08-18 ENCOUNTER — Encounter: Payer: Self-pay | Admitting: *Deleted

## 2016-08-18 ENCOUNTER — Emergency Department (INDEPENDENT_AMBULATORY_CARE_PROVIDER_SITE_OTHER): Payer: 59

## 2016-08-18 DIAGNOSIS — M25562 Pain in left knee: Secondary | ICD-10-CM

## 2016-08-18 MED ORDER — NAPROXEN 375 MG PO TABS
375.0000 mg | ORAL_TABLET | Freq: Two times a day (BID) | ORAL | 0 refills | Status: DC
Start: 2016-08-18 — End: 2021-08-16

## 2016-08-18 NOTE — ED Triage Notes (Signed)
Patient c/o left knee popping when getting in bed last night. He is unable to straighten it completely. Wearing a brace. Reports this has happened a couple times in the past.

## 2016-08-18 NOTE — ED Provider Notes (Signed)
CSN: [id number]     Arrival date & time 08/18/16  4098 History   First MD Initiated Contact with Patient 08/18/16 1019     Chief Complaint  Patient presents with  . Knee Pain    left   (Consider location/radiation/quality/duration/timing/severity/associated sxs/prior Treatment) HPI  Jackson Fisher is a 41 y.o. male presenting to UC with c/o exacerbation of Left knee pain since last night.  Pt reports hx of intermittent Left knee pain for a few years but has not been seen by an orthopedist or sports medicine provider.  Last night he felt a pop and feels like he cannot straighten his leg completely due to the pain.  Pain is aching, 8/10 at this time. He has not taken any OTC medications due to hx of liver problems so he is unsure what he can take.  No specific known injury.   Past Medical History:  Diagnosis Date  . Barrett's esophagus   . Clostridium difficile infection   . Diverticulosis of colon (without mention of hemorrhage)   . Esophageal reflux   . External hemorrhoids without mention of complication   . Fatty liver   . GERD (gastroesophageal reflux disease)   . Hiatal hernia   . Hyperlipidemia   . Hypertension   . Liver disease   . 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 No pertinent surgical history. Family History  Problem Relation Age of Onset  . Diabetes Father   . Breast cancer Mother    Social History  Substance Use Topics  . Smoking status: Never Smoker  . Smokeless tobacco: Never Used  . Alcohol use No     Comment: Quit 10/29/15    Review of Systems  Musculoskeletal: Positive for arthralgias. Negative for joint swelling and myalgias.  Skin: Negative for color change and wound.  Neurological: Negative for weakness and numbness.    Allergies  Ambien [zolpidem tartrate]  Home Medications   Prior to Admission medications   Medication Sig Start Date End Date Taking? Authorizing Provider  omeprazole (PRILOSEC) 40 MG capsule Take 40 mg  by mouth daily.   Yes Historical Provider, MD  cloNIDine (CATAPRES) 0.1 MG tablet Take 0.1 mg by mouth 2 (two) times daily.    Historical Provider, MD  lansoprazole (PREVACID) 30 MG capsule Take 30 mg by mouth daily at 12 noon.    Historical Provider, MD  naproxen (NAPROSYN) 375 MG tablet Take 1 tablet (375 mg total) by mouth 2 (two) times daily. 08/18/16   Junius Finner, PA-C  pyridOXINE (VITAMIN B-6) 100 MG tablet Take 100 mg by mouth daily.    Historical Provider, MD  thiamine (VITAMIN B-1) 100 MG tablet Take 100 mg by mouth daily.    Historical Provider, MD  traMADol (ULTRAM) 50 MG tablet Take by mouth every 6 (six) hours as needed.    Historical Provider, MD   Meds Ordered and Administered this Visit  Medications - No data to display  BP (!) 94/58 (BP Location: Left Arm)   Pulse 65   Wt 215 lb (97.5 kg)   SpO2 94%   BMI 28.37 kg/m  No data found.   Physical Exam  Constitutional: He is oriented to person, place, and time. He appears well-developed and well-nourished.  HENT:  Head: Normocephalic and atraumatic.  Eyes: EOM are normal.  Neck: Normal range of motion.  Cardiovascular: Normal rate.   Pulmonary/Chest: Effort normal.  Musculoskeletal: Normal range of motion. He exhibits tenderness. He exhibits no edema.  Left knee: no obvious edema. Full ROM. Tenderness to inferior lateral aspect.   Neurological: He is alert and oriented to person, place, and time.  Skin: Skin is warm and dry.  Left knee: skin in tact. No ecchymosis or erythema.   Psychiatric: He has a normal mood and affect. His behavior is normal.  Nursing note and vitals reviewed.   Urgent Care Course     Procedures (including critical care time)  Labs Review Labs Reviewed - No data to display  Imaging Review Dg Knee Complete 4 Views Left  Result Date: 08/18/2016 CLINICAL DATA:  Left knee pain after twisting injury last night. EXAM: LEFT KNEE - COMPLETE 4+ VIEW COMPARISON:  None. FINDINGS: No evidence of  fracture, dislocation, or joint effusion. No evidence of arthropathy or other focal bone abnormality. Soft tissues are unremarkable. IMPRESSION: Negative. Electronically Signed   By: Kennith Center M.D.   On: 08/18/2016 10:50     MDM   1. Left knee pain    Exacerbation of Left knee pain w/o known cause.  Plain films: unremarkable  Knee brace applied for comfort Rx: Naproxen   f/u with PCP or Sports Medicine for further evaluation and treatment of knee pain.     Junius Finner, PA-C 08/18/16 1126

## 2016-08-18 NOTE — Discharge Instructions (Signed)
°  Naproxen (Naprosyn) is an antiinflammatory to help with pain and inflammation.  Do not take ibuprofen, Advil, Aleve, or any other medications that contain NSAIDs while taking meloxicam as this may cause stomach upset or even ulcers if taken in large amounts for an extended period of time.  ° °

## 2018-02-24 ENCOUNTER — Emergency Department
Admission: EM | Admit: 2018-02-24 | Discharge: 2018-02-24 | Disposition: A | Discharge status: Home / Self Care | Payer: 59 | Source: Home / Self Care | Attending: Family Medicine | Admitting: Family Medicine

## 2018-02-24 ENCOUNTER — Other Ambulatory Visit: Payer: Self-pay

## 2018-02-24 ENCOUNTER — Emergency Department (INDEPENDENT_AMBULATORY_CARE_PROVIDER_SITE_OTHER): Payer: 59

## 2018-02-24 ENCOUNTER — Encounter: Payer: Self-pay | Admitting: *Deleted

## 2018-02-24 DIAGNOSIS — M545 Low back pain, unspecified: Secondary | ICD-10-CM

## 2018-02-24 DIAGNOSIS — M25551 Pain in right hip: Secondary | ICD-10-CM

## 2018-02-24 DIAGNOSIS — M5136 Other intervertebral disc degeneration, lumbar region: Secondary | ICD-10-CM

## 2018-02-24 DIAGNOSIS — M25552 Pain in left hip: Secondary | ICD-10-CM | POA: Diagnosis not present

## 2018-02-24 MED ORDER — CYCLOBENZAPRINE HCL 10 MG PO TABS: 10.0000 mg | ORAL_TABLET | Freq: Two times a day (BID) | ORAL | 0 refills | Status: DC | PRN

## 2018-02-24 MED ORDER — MELOXICAM 15 MG PO TABS: 15.0000 mg | ORAL_TABLET | Freq: Every day | ORAL | 0 refills | Status: DC

## 2018-02-24 NOTE — Discharge Instructions (Signed)
°  Meloxicam (Mobic) is an antiinflammatory to help with pain and inflammation.  Do not take ibuprofen, Advil, Aleve, or any other medications that contain NSAIDs while taking meloxicam as this may cause stomach upset or even ulcers if taken in large amounts for an extended period of time.   Flexeril (cyclobenzaprine) is a muscle relaxer and may cause drowsiness. Do not drink alcohol, drive, or operate heavy machinery while taking.  Please follow up with family medicine or sports medicine in 1 week if not improving.

## 2018-02-24 NOTE — ED Triage Notes (Signed)
Patient c/o low back pain without injury x 4 days. Awakened with the pain. Used Advil, tylenol and heat. PCP unable to see him for several days.

## 2018-02-24 NOTE — ED Provider Notes (Signed)
[facility]    CSN: [id number] Arrival date & time: 02/24/18  1152     History   Chief Complaint Chief Complaint  Patient presents with  . Back Pain    HPI Jackson Fisher is a 42 y.o. male.   HPI Jackson Fisher is a 42 y.o. male presenting to UC with c/o low back pain that started 4 days ago after waking. No known injury. Pain is aching and sore, worse with certain movements. He has tried Advil, tylenol and heat with minimal relief.  He cannot f/u with his PCP for a few weeks.  Denies radiation of pain or numbness to arms or legs.    Past Medical History:  Diagnosis Date  . Barrett's esophagus   . Clostridium difficile infection   . Diverticulosis of colon (without mention of hemorrhage)   . Esophageal reflux   . External hemorrhoids without mention of complication   . Fatty liver   . GERD (gastroesophageal reflux disease)   . Hiatal hernia   . Hyperlipidemia   . Hypertension   . Liver disease   . 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 OBESITY 08/23/2007  . GERD 08/23/2007  . BARRETT'S ESOPHAGUS 08/23/2007  . FATTY LIVER DISEASE 08/23/2007    History reviewed. No pertinent surgical history.     Home Medications    Prior to Admission medications   Medication Sig Start Date End Date Taking? Authorizing Provider  lansoprazole (PREVACID) 30 MG capsule Take 30 mg by mouth daily at 12 noon.   Yes [provider]  nadolol (CORGARD) 40 MG tablet  02/17/18  Yes [provider]  naproxen (NAPROSYN) 375 MG tablet Take 1 tablet (375 mg total) by mouth 2 (two) times daily. 08/18/16  Yes Riddik Senna O, PA-C  pyridOXINE (VITAMIN B-6) 100 MG tablet Take 100 mg by mouth daily.   Yes [provider]  thiamine (VITAMIN B-1) 100 MG tablet Take 100 mg by mouth daily.   Yes [provider]  cyclobenzaprine (FLEXERIL) 10 MG tablet Take 1 tablet (10 mg total) by mouth 2  (two) times daily as needed. 02/24/18   Lurene Shadow, PA-C  meloxicam (MOBIC) 15 MG tablet Take 1 tablet (15 mg total) by mouth daily. 02/24/18   Lurene Shadow, PA-C    Family History Family History  Problem Relation Age of Onset  . Diabetes Father   . Breast cancer Mother     Social History Social History   Tobacco Use  . Smoking status: Never Smoker  . Smokeless tobacco: Never Used  Substance Use Topics  . Alcohol use: No    Comment: Quit 10/29/15  . Drug use: No     Allergies   Ambien [zolpidem tartrate]   Review of Systems Review of Systems  Constitutional: Negative for chills and fever.  Genitourinary: Negative for dysuria, flank pain and frequency.  Musculoskeletal: Positive for back pain and myalgias. Negative for arthralgias.  Skin: Negative for rash.  Neurological: Negative for weakness and numbness.     Physical Exam Triage Vital Signs ED Triage Vitals  Enc Vitals Group     BP 02/24/18 1209 115/65     Pulse Rate 02/24/18 1209 (!) 53     Resp 02/24/18 1209 14     Temp 02/24/18 1209 98.3 F (36.8 C)     Temp Source 02/24/18 1209 Oral     SpO2  02/24/18 1209 99 %     Weight 02/24/18 1210 230 lb (104.3 kg)     Height --      Head Circumference --      Peak Flow --      Pain Score 02/24/18 1210 9     Pain Loc --      Pain Edu? --      Excl. in GC? --    No data found.  Updated Vital Signs BP 115/65 (BP Location: Right Arm)   Pulse (!) 53   Temp 98.3 F (36.8 C) (Oral)   Resp 14   Wt 230 lb (104.3 kg)   SpO2 99%   BMI 30.34 kg/m   Visual Acuity Right Eye Distance:   Left Eye Distance:   Bilateral Distance:    Right Eye Near:   Left Eye Near:    Bilateral Near:     Physical Exam  Constitutional: He is oriented to person, place, and time. He appears well-developed and well-nourished.  HENT:  Head: Normocephalic and atraumatic.  Mouth/Throat: Oropharynx is clear and moist.  Eyes: EOM are normal.  Neck: Normal range of motion.    Cardiovascular: Normal rate.  Pulmonary/Chest: Effort normal. No respiratory distress.  Musculoskeletal: Normal range of motion. He exhibits tenderness. He exhibits no edema.  Tenderness across lower lumbar muscles and spine.  Negative straight leg raise.  Normal gait.  Full ROM upper and lower extremities bilaterally.   Neurological: He is alert and oriented to person, place, and time.  Skin: Skin is warm and dry.  Psychiatric: He has a normal mood and affect. His behavior is normal.  Nursing note and vitals reviewed.    UC Treatments / Results  Labs (all labs ordered are listed, but only abnormal results are displayed) Labs Reviewed - No data to display  EKG None  Radiology Dg Lumbar Spine Complete  Result Date: 02/24/2018 CLINICAL DATA:  Low back pain for 4 days with bilateral hip and groin pain. EXAM: LUMBAR SPINE - COMPLETE 4+ VIEW COMPARISON:  CT, 07/31/2015 FINDINGS: No fracture, bone lesion or spondylolisthesis. Mild loss of disc height at L4-L5 with moderate loss of disc height at L5-S1. Small endplate osteophytes noted from the mid through the lower lumbar spine most prominent at L5-S1. Facet joints are well preserved. Soft tissues are unremarkable. IMPRESSION: 1. No fracture or acute finding. 2. Degenerative changes most evident at L5-S1. No change when compared to the CT dated 07/31/2015. Electronically Signed   By: Amie Portland M.D.   On: 02/24/2018 12:52    Procedures Procedures (including critical care time)  Medications Ordered in UC Medications - No data to display  Initial Impression / Assessment and Plan / UC Course  I have reviewed the triage vital signs and the nursing notes.  Pertinent labs & imaging results that were available during my care of the patient were reviewed by me and considered in my medical decision making (see chart for details).     Hx and exam c/w muscle strain No red flag symptoms Encouraged symptomatic treatment.  Final  Clinical Impressions(s) / UC Diagnoses   Final diagnoses:  Acute bilateral low back pain without sciatica  Lumbar degenerative disc disease     Discharge Instructions      Meloxicam (Mobic) is an antiinflammatory to help with pain and inflammation.  Do not take ibuprofen, Advil, Aleve, or any other medications that contain NSAIDs while taking meloxicam as this may cause stomach upset or even ulcers if  taken in large amounts for an extended period of time.   Flexeril (cyclobenzaprine) is a muscle relaxer and may cause drowsiness. Do not drink alcohol, drive, or operate heavy machinery while taking.  Please follow up with family medicine or sports medicine in 1 week if not improving.     ED Prescriptions    Medication Sig Dispense Auth. Provider   cyclobenzaprine (FLEXERIL) 10 MG tablet Take 1 tablet (10 mg total) by mouth 2 (two) times daily as needed. 20 tablet Lurene Shadow, PA-C   meloxicam (MOBIC) 15 MG tablet Take 1 tablet (15 mg total) by mouth daily. 20 tablet Lurene Shadow, PA-C     Controlled Substance Prescriptions [place] Controlled Substance Registry consulted? Not Applicable   Rolla Plate 02/24/18 1417

## 2019-03-10 IMAGING — DX DG LUMBAR SPINE COMPLETE 4+V
5 series · 5 of 5 positions shown · non-contrast
Comparison: CT, 07/31/2015

CLINICAL DATA: Low back pain for 4 days with bilateral hip and
groin pain.

EXAM:
LUMBAR SPINE - COMPLETE 4+ VIEW

[l-spine ap]
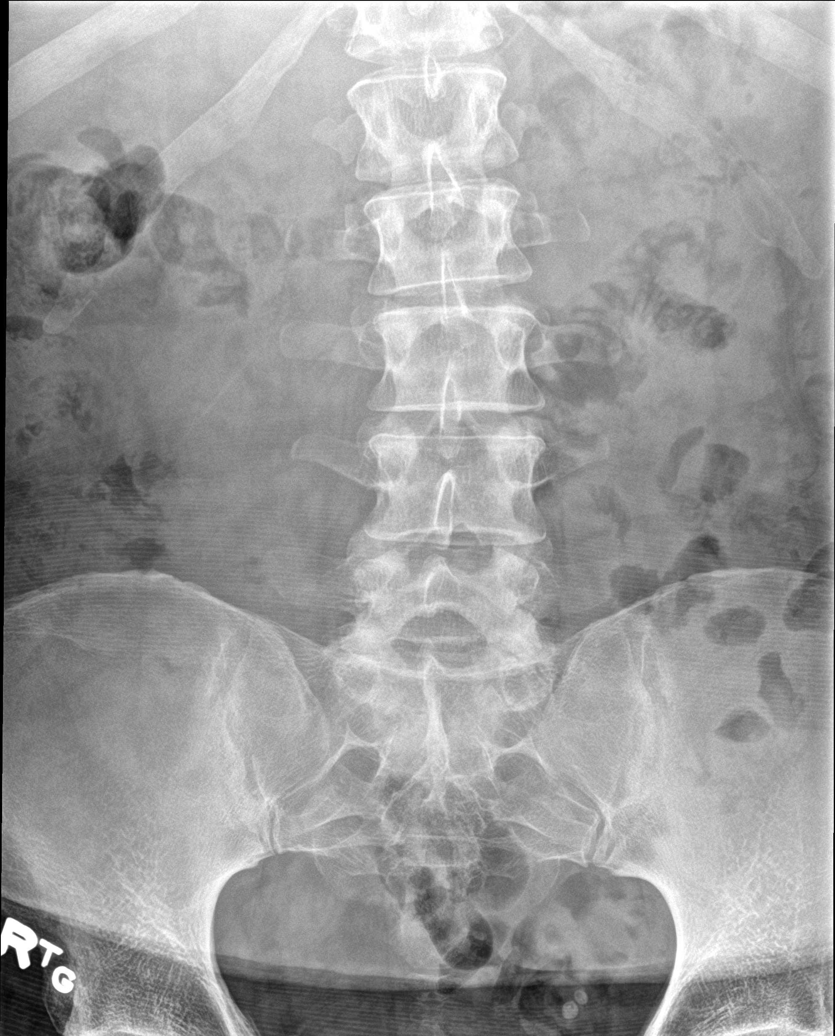

[l-spine obl (1 of 2)]
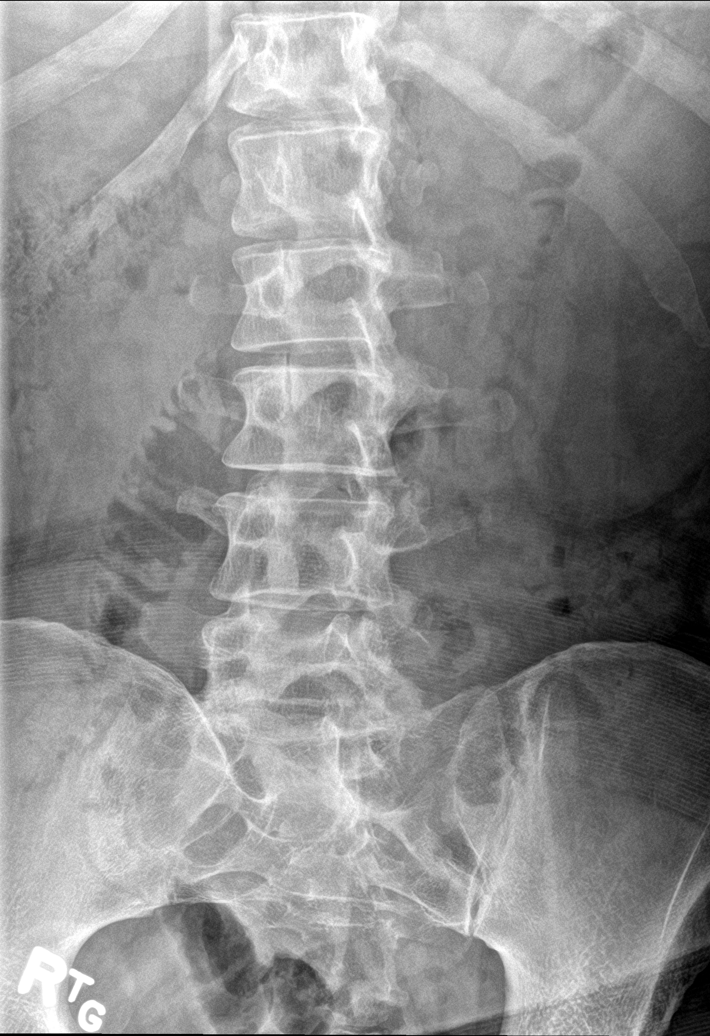

[l-spine obl (2 of 2)]
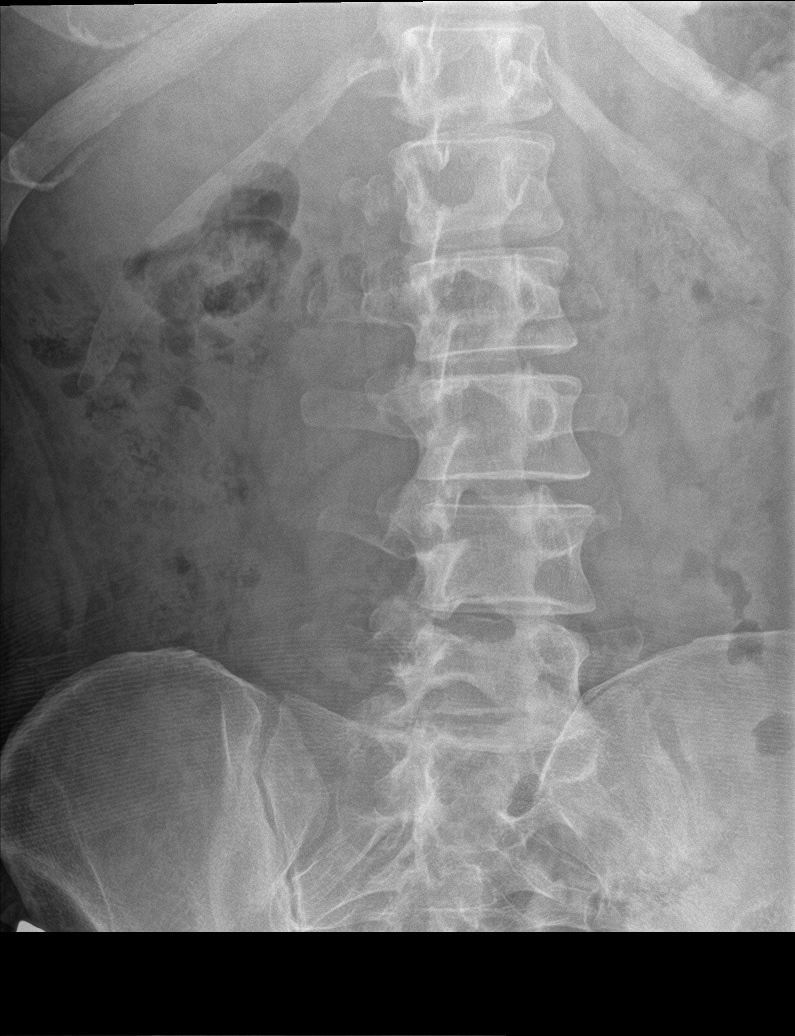

[l-spine lat]
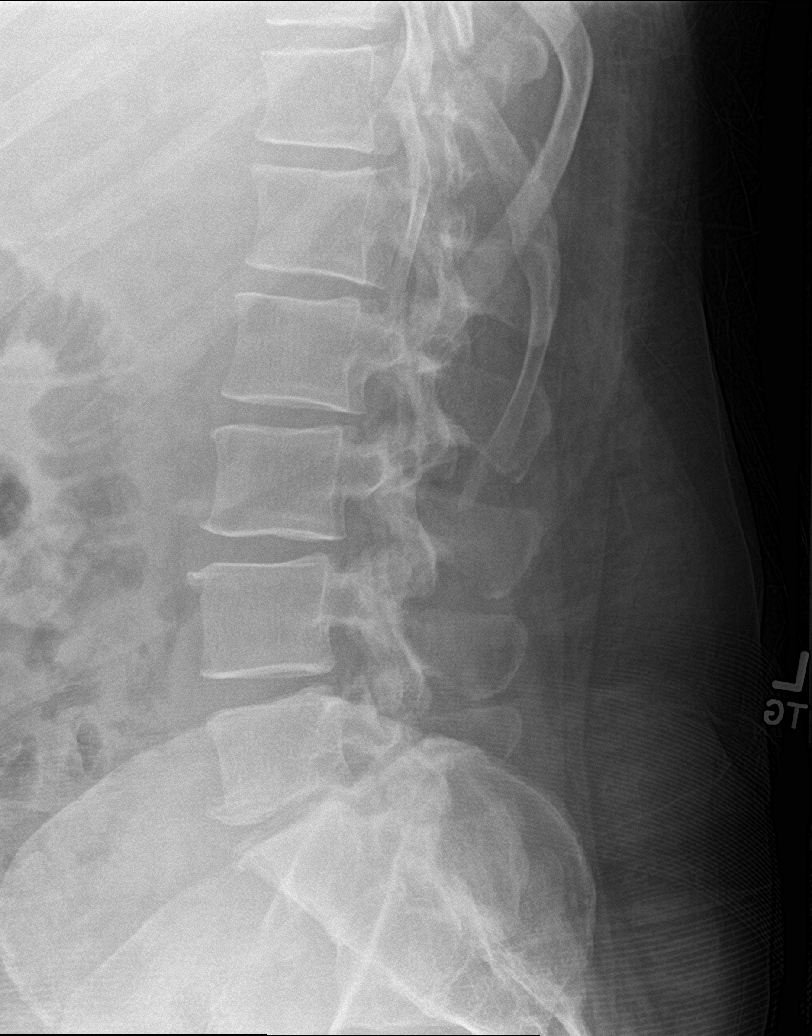

[l-spine spot]
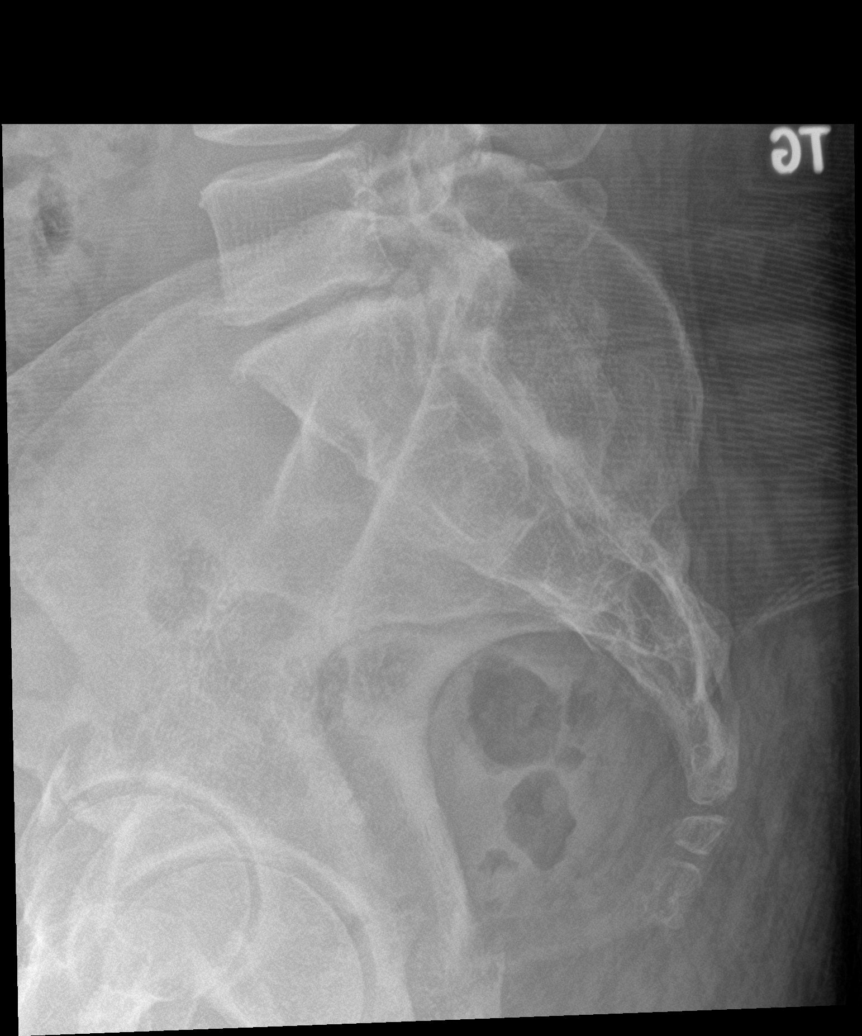

[5 of 5 positions shown; findings below may reference images not displayed]

FINDINGS: No fracture, bone lesion or spondylolisthesis.

Mild loss of disc height at L4-L5 with moderate loss of disc height
at L5-S1. Small endplate osteophytes noted from the mid through the
lower lumbar spine most prominent at L5-S1. Facet joints are well
preserved.

Soft tissues are unremarkable.
IMPRESSION: 1. No fracture or acute finding.
2. Degenerative changes most evident at L5-S1. No change when
compared to the CT dated 07/31/2015.

## 2021-08-16 ENCOUNTER — Emergency Department
Admission: EM | Admit: 2021-08-16 | Discharge: 2021-08-16 | Disposition: A | Discharge status: Home / Self Care | Payer: 59 | Source: Home / Self Care | Attending: Family Medicine | Admitting: Family Medicine

## 2021-08-16 DIAGNOSIS — L03317 Cellulitis of buttock: Secondary | ICD-10-CM | POA: Diagnosis not present

## 2021-08-16 DIAGNOSIS — W57XXXA Bitten or stung by nonvenomous insect and other nonvenomous arthropods, initial encounter: Secondary | ICD-10-CM | POA: Diagnosis not present

## 2021-08-16 MED ORDER — SULFAMETHOXAZOLE-TRIMETHOPRIM 800-160 MG PO TABS: 2.0000 | ORAL_TABLET | Freq: Two times a day (BID) | ORAL | 0 refills | Status: DC

## 2021-08-16 NOTE — ED Triage Notes (Signed)
Pt states that he was bit by an insect on his left hip. X2 days ?

## 2021-08-16 NOTE — ED Provider Notes (Signed)
?[facility] ? ? ? ?CSN: [id number] ?Arrival date & time: 08/16/21  [id number] ? ? ?  ? ?History   ?Chief Complaint ?Chief Complaint  ?Patient presents with  ? Insect Bite  ?  Insect bite. X2 days  ? ? ?HPI ?Jackson Fisher is a 46 y.o. male.  ? ?HPI ? ?Patient is here for treatment of a spider bite.  He states that he got a bite on his buttocks a couple of days ago.  He is becoming increasingly red, swollen, and painful.  He does not have any itching.  He states today he feels some body aches. ?Patient has end-stage liver failure, cirrhosis, hypertension and hyperlipidemia.  He is under treatment with primary care and gastroenterology. ? ?Past Medical History:  ?Diagnosis Date  ? Barrett's esophagus   ? Clostridium difficile infection   ? Diverticulosis of colon (without mention of hemorrhage)   ? Esophageal reflux   ? External hemorrhoids without mention of complication   ? Fatty liver   ? GERD (gastroesophageal reflux disease)   ? Hiatal hernia   ? Hyperlipidemia   ? Hypertension   ? Liver disease   ? 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 ? ?Patient Active Problem List  ? Diagnosis Date Noted  ? OBESITY 08/23/2007  ? GERD 08/23/2007  ? BARRETT'S ESOPHAGUS 08/23/2007  ? FATTY LIVER DISEASE 08/23/2007  ? ? ?History reviewed. No pertinent surgical history. ? ? ? ? ?Home Medications   ? ?Prior to Admission medications   ?Medication Sig Start Date End Date Taking? Authorizing Provider  ?lansoprazole (PREVACID) 30 MG capsule Take 30 mg by mouth daily at 12 noon.   Yes [provider]  ?nadolol (CORGARD) 40 MG tablet  02/17/18  Yes [provider]  ?pyridOXINE (VITAMIN B-6) 100 MG tablet Take 100 mg by mouth daily.   Yes [provider]  ?sulfamethoxazole-trimethoprim (BACTRIM DS) 800-160 MG tablet Take 2 tablets by mouth 2 (two) times daily for 7 days. 08/16/21 08/23/21 Yes Raylene Everts, MD  ?thiamine (VITAMIN B-1) 100 MG tablet Take 100 mg by mouth daily.   Yes [provider]  ? ? ?Family History ?Family History  ?Problem Relation Age of Onset  ? Diabetes Father   ? Breast cancer Mother   ? ? ?Social History ?Social History  ? ?Tobacco Use  ? Smoking status: Never  ? Smokeless tobacco: Never  ?Substance Use Topics  ? Alcohol use: No  ?  Comment: Quit 10/29/15  ? Drug use: No  ? ? ? ?Allergies   ?Ambien [zolpidem tartrate] ? ? ?Review of Systems ?Review of Systems ?See HPI ? ?Physical Exam ?Triage Vital Signs ?ED Triage Vitals  ?Enc Vitals Group  ?   BP 08/16/21 0835 124/79  ?   Pulse Rate 08/16/21 0835 (!) 59  ?   Resp 08/16/21 0835 18  ?   Temp 08/16/21 0835 98.7 ?F (37.1 ?C)  ?   Temp Source 08/16/21 0835 Oral  ?   SpO2 08/16/21 0835 96 %  ?   Weight 08/16/21 0833 230 lb (104.3 kg)  ?   Height 08/16/21 0833 6\' 1"  (1.854 m)  ?   Head Circumference --   ?   Peak Flow --   ?   Pain Score 08/16/21 0832 6  ?   Pain Loc --   ?   Pain Edu? --   ?   Excl. in [place]? --   ? ?No data found. ? ?  Updated Vital Signs ?BP 124/79 (BP Location: Left Arm)   Pulse (!) 59   Temp 98.7 ?F (37.1 ?C) (Oral)   Resp 18   Ht 6\' 1"  (1.854 m)   Wt 104.3 kg   SpO2 96%   BMI 30.34 kg/m?  ? ?   ? ?Physical Exam ?Constitutional:   ?   General: He is not in acute distress. ?   Appearance: He is well-developed. He is ill-appearing.  ?HENT:  ?   Head: Normocephalic and atraumatic.  ?Eyes:  ?   Conjunctiva/sclera: Conjunctivae normal.  ?   Pupils: Pupils are equal, round, and reactive to light.  ?Cardiovascular:  ?   Rate and Rhythm: Bradycardia present.  ?   Heart sounds: Normal heart sounds.  ?Pulmonary:  ?   Effort: Pulmonary effort is normal. No respiratory distress.  ?   Breath sounds: Normal breath sounds.  ?Abdominal:  ?   General: There is no distension.  ?   Palpations: Abdomen is soft.  ?Musculoskeletal:     ?   General: Normal range of motion.  ?   Cervical back: Normal range of motion.  ?Skin: ?   General: Skin is warm and dry.  ?   Comments: Right lateral buttock has an indurated area that  measures 5 cm across.  It is deeply erythematous.  There is a punctate wound in the center.  No fluctuance  ?Neurological:  ?   General: No focal deficit present.  ?   Mental Status: He is alert.  ?Psychiatric:     ?   Mood and Affect: Mood normal.     ?   Behavior: Behavior normal.  ? ? ? ?UC Treatments / Results  ?Labs ?(all labs ordered are listed, but only abnormal results are displayed) ?Labs Reviewed - No data to display ? ?EKG ? ? ?Radiology ?No results found. ? ?Procedures ?Procedures (including critical care time) ? ?Medications Ordered in UC ?Medications - No data to display ? ?Initial Impression / Assessment and Plan / UC Course  ?I have reviewed the triage vital signs and the nursing notes. ? ?Pertinent labs & imaging results that were available during my care of the patient were reviewed by me and considered in my medical decision making (see chart for details). ? ?  ? ?Final Clinical Impressions(s) / UC Diagnoses  ? ?Final diagnoses:  ?Cellulitis of buttock  ?Insect bite, unspecified site, initial encounter  ? ? ? ?Discharge Instructions   ? ?  ?Take the antibiotic 2 times a day for 7 days ?Take this antibiotic with food ?Try to get both doses in today, now and then at bedtime ?Use warm compresses to area ?See your doctor if not improving in a couple of days ? ? ?ED Prescriptions   ? ? Medication Sig Dispense Auth. Provider  ? sulfamethoxazole-trimethoprim (BACTRIM DS) 800-160 MG tablet Take 2 tablets by mouth 2 (two) times daily for 7 days. 28 tablet Raylene Everts, MD  ? ?  ? ?PDMP not reviewed this encounter. ?  ?Raylene Everts, MD ?08/16/21 [phone] ? ?

## 2021-08-16 NOTE — Discharge Instructions (Signed)
Take the antibiotic 2 times a day for 7 days ?Take this antibiotic with food ?Try to get both doses in today, now and then at bedtime ?Use warm compresses to area ?See your doctor if not improving in a couple of days ?

## 2021-08-20 ENCOUNTER — Telehealth: Payer: Self-pay | Admitting: Emergency Medicine

## 2021-08-20 ENCOUNTER — Emergency Department (INDEPENDENT_AMBULATORY_CARE_PROVIDER_SITE_OTHER)
Admission: EM | Admit: 2021-08-20 | Discharge: 2021-08-20 | Disposition: A | Discharge status: Home / Self Care | Payer: 59 | Source: Home / Self Care

## 2021-08-20 DIAGNOSIS — D696 Thrombocytopenia, unspecified: Secondary | ICD-10-CM | POA: Diagnosis not present

## 2021-08-20 DIAGNOSIS — L0231 Cutaneous abscess of buttock: Secondary | ICD-10-CM | POA: Diagnosis not present

## 2021-08-20 DIAGNOSIS — R233 Spontaneous ecchymoses: Secondary | ICD-10-CM

## 2021-08-20 DIAGNOSIS — L03317 Cellulitis of buttock: Secondary | ICD-10-CM | POA: Diagnosis not present

## 2021-08-20 MED ORDER — MUPIROCIN 2 % EX OINT: 1.0000 "application " | TOPICAL_OINTMENT | Freq: Three times a day (TID) | CUTANEOUS | 0 refills | Status: AC

## 2021-08-20 MED ORDER — CEPHALEXIN 500 MG PO CAPS: 500.0000 mg | ORAL_CAPSULE | Freq: Four times a day (QID) | ORAL | 0 refills | Status: AC

## 2021-08-20 NOTE — Telephone Encounter (Signed)
Reviewed chart, pt likely having a mild allergic reaction to bactrim. Will request he stop this medication and take benadryl. Please double check and make sure he is having no SOB, wheezing, tightness in chest, drooling, lip or tongue swelling. If any of those symptoms, he will need to be seen in our office. Otherwise, he should take 25mg  benadryl OTC and we can call in clindamycin for him in place of the bactrim. ?

## 2021-08-20 NOTE — Telephone Encounter (Signed)
Call from [place] regarding a possible allergic reaction to Bactrim he started on 08/16/21 for a spider bite. Jackson Fisher states he has "small red hot spots" all over now and wants to know if he should stop the medicine. If so should a new one be sent in. Pt states the area on the buttock has drained  a lot - denies any fevers. ?

## 2021-08-20 NOTE — Telephone Encounter (Signed)
Call back to [place]  who agreed to come in for a wound recheck per provider request. Jackson Fisher is to stop Bactrim  ( has not taken Bactrim today) okay to take Benadryl at night & OTC  non-drowsy allergy medicine now. No other questions at this time  ?

## 2021-08-20 NOTE — Discharge Instructions (Addendum)
Your rash is petechial.  This is likely secondary to your Bactrim.  We have checked a CBC to recheck your platelet counts. ?Please stop taking your bactrim. ?Start taking keflex, a different antibiotic, four times daily until gone. ?Monitor your rash, ensure it is not spreading past the drawn line. ?Continue warm compresses to your abscess, after each soak apply topical antibiotic ointment. ?Please follow up with your PCP ?

## 2021-08-20 NOTE — ED Provider Notes (Signed)
?[facility] ? ? ? ?CSN: [id number] ?Arrival date & time: 08/20/21  1036 ? ? ?  ? ?History   ?Chief Complaint ?Chief Complaint  ?Patient presents with  ? Wound Check  ?  recheck  ? ? ?HPI ?Jackson Fisher is a 46 y.o. male.  ? ?P[place] 46 year old male presents today for recheck.  He was seen here on 08/16/2021 and diagnosed with cellulitis of the left buttock secondary to a suspected insect bite.  He was discharged home on Bactrim.  He has been taking this as prescribed until this morning when he noticed red spots scattered intermittently across his body, most notable to his left foot, bilateral axilla, and waist line.  He has never taken Bactrim in the past per his knowledge.  He does have a history of cirrhosis and thrombocytopenia, with his last platelet level of 64 on 08/13/21.  He denies any cough, shortness of breath, wheezing, tongue or lip swelling.  He denies any raised hives.  He feels that his lesion on his left buttock has improved slightly, but "not as much as it should".  He states it did come to a head and has been draining "extensively".  He denies fever.  ? ? ?Wound Check ? ? ?Past Medical History:  ?Diagnosis Date  ? Barrett's esophagus   ? Clostridium difficile infection   ? Diverticulosis of colon (without mention of hemorrhage)   ? Esophageal reflux   ? External hemorrhoids without mention of complication   ? Fatty liver   ? GERD (gastroesophageal reflux disease)   ? Hiatal hernia   ? Hyperlipidemia   ? Hypertension   ? Liver disease   ? 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 ? ?Patient Active Problem List  ? Diagnosis Date Noted  ? OBESITY 08/23/2007  ? GERD 08/23/2007  ? BARRETT'S ESOPHAGUS 08/23/2007  ? FATTY LIVER DISEASE 08/23/2007  ? ? ?History reviewed. No pertinent surgical history. ? ? ? ? ?Home Medications   ? ?Prior to Admission medications   ?Medication Sig Start Date End Date Taking? Authorizing Provider  ?cephALEXin (KEFLEX) 500 MG capsule Take 1 capsule (500 mg  total) by mouth 4 (four) times daily for 7 days. 08/20/21 08/27/21 Yes Arek Spadafore L, PA  ?mupirocin ointment (BACTROBAN) 2 % Apply 1 application. topically 3 (three) times daily. 08/20/21  Yes Aubrina Nieman L, PA  ?lansoprazole (PREVACID) 30 MG capsule Take 30 mg by mouth daily at 12 noon.    [provider]  ?nadolol (CORGARD) 40 MG tablet  02/17/18   [provider]  ?pyridOXINE (VITAMIN B-6) 100 MG tablet Take 100 mg by mouth daily.    [provider]  ?thiamine (VITAMIN B-1) 100 MG tablet Take 100 mg by mouth daily.    [provider]  ? ? ?Family History ?Family History  ?Problem Relation Age of Onset  ? Diabetes Father   ? Breast cancer Mother   ? ? ?Social History ?Social History  ? ?Tobacco Use  ? Smoking status: Never  ? Smokeless tobacco: Never  ?Substance Use Topics  ? Alcohol use: No  ?  Comment: Quit 10/29/15  ? Drug use: No  ? ? ? ?Allergies   ?Ambien [zolpidem tartrate] ? ? ?Review of Systems ?Review of Systems  ?Skin:  Positive for rash and wound (lesion L buttock, draining).  ? ? ?Physical Exam ?Triage Vital Signs ?ED Triage Vitals  ?Enc Vitals Group  ?   BP 08/20/21 1044 122/71  ?  Pulse Rate 08/20/21 1044 60  ?   Resp 08/20/21 1044 18  ?   Temp 08/20/21 1044 98.4 ?F (36.9 ?C)  ?   Temp Source 08/20/21 1044 Oral  ?   SpO2 08/20/21 1044 97 %  ?   Weight --   ?   Height --   ?   Head Circumference --   ?   Peak Flow --   ?   Pain Score 08/20/21 1045 6  ?   Pain Loc --   ?   Pain Edu? --   ?   Excl. in [place]? --   ? ?No data found. ? ?Updated Vital Signs ?BP 122/71 (BP Location: Right Arm)   Pulse 60   Temp 98.4 ?F (36.9 ?C) (Oral)   Resp 18   SpO2 97%  ? ?Visual Acuity ?Right Eye Distance:   ?Left Eye Distance:   ?Bilateral Distance:   ? ?Right Eye Near:   ?Left Eye Near:    ?Bilateral Near:    ? ?Physical Exam ?Vitals and nursing note reviewed.  ?Constitutional:   ?   General: He is not in acute distress. ?   Appearance: Normal appearance. He is obese. He is not  toxic-appearing or diaphoretic.  ?HENT:  ?   Head: Normocephalic.  ?   Mouth/Throat:  ?   Mouth: Mucous membranes are moist.  ?   Pharynx: Oropharynx is clear. No oropharyngeal exudate or posterior oropharyngeal erythema.  ?Cardiovascular:  ?   Rate and Rhythm: Normal rate.  ?   Pulses: Normal pulses.  ?Pulmonary:  ?   Effort: Pulmonary effort is normal. No respiratory distress.  ?   Breath sounds: Normal breath sounds. No wheezing or rhonchi.  ?Skin: ?   General: Skin is warm and dry.  ?   Capillary Refill: Capillary refill takes less than 2 seconds.  ?   Coloration: Skin is not jaundiced.  ?   Findings: Erythema (petechial rash noted to L foot over 2nd and 3rd MTPs/ phalanx, bilateral waist line, and bilateral axilla) and lesion (3.5" cellulitis with punctate, draining center, indurated and warm. No fluctuance) present.  ?Neurological:  ?   General: No focal deficit present.  ?   Mental Status: He is alert and oriented to person, place, and time.  ? ? ? ?UC Treatments / Results  ?Labs ?(all labs ordered are listed, but only abnormal results are displayed) ?Labs Reviewed  ?CBC WITH DIFFERENTIAL/PLATELET  ? ? ?EKG ? ? ?Radiology ?No results found. ? ?Procedures ?Procedures (including critical care time) ? ?Medications Ordered in UC ?Medications - No data to display ? ?Initial Impression / Assessment and Plan / UC Course  ?I have reviewed the triage vital signs and the nursing notes. ? ?Pertinent labs & imaging results that were available during my care of the patient were reviewed by me and considered in my medical decision making (see chart for details). ? ?  ? ?Petechiae -patient's rash does not look like hives or a standard allergic reaction.  Patient does have a known history of thrombocytopenia, therefore will recheck CBC today.  Bactrim does have the potential to worsen thrombocytopenia and/or cause agranulocytosis, therefore we will DC this medication.  No other s/sx concerning for allergic reaction. We will  switch to standard Keflex, and add topical mupirocin.  Skin marker was used to make a line around the affected area, patient requested to return to clinic should the redness or swelling extend past this mark in the next  24 hours. ?Cellulitis and abscess of buttock -patient believes it has been improving.  As above, start Keflex and topical mupirocin.  Monitor for improvement, return to clinic if any spreading outside of the marker line. ?Thrombocytopenia -last platelet count 64.  Recheck today. ? ?Final Clinical Impressions(s) / UC Diagnoses  ? ?Final diagnoses:  ?Petechiae  ?Cellulitis and abscess of buttock  ?Thrombocytopenia ([place])  ? ? ? ?Discharge Instructions   ? ?  ?Your rash is petechial.  This is likely secondary to your Bactrim.  We have checked a CBC to recheck your platelet counts. ?Please stop taking your bactrim. ?Start taking keflex, a different antibiotic, four times daily until gone. ?Monitor your rash, ensure it is not spreading past the drawn line. ?Continue warm compresses to your abscess, after each soak apply topical antibiotic ointment. ?Please follow up with your PCP ? ? ? ?ED Prescriptions   ? ? Medication Sig Dispense Auth. Provider  ? cephALEXin (KEFLEX) 500 MG capsule Take 1 capsule (500 mg total) by mouth 4 (four) times daily for 7 days. 28 capsule Cindel Daugherty L, PA  ? mupirocin ointment (BACTROBAN) 2 % Apply 1 application. topically 3 (three) times daily. 22 g Khameron Gruenwald L, PA  ? ?  ? ?PDMP not reviewed this encounter. ?  Chaney Malling, [place] ?08/20/21 [id number] ? ?

## 2021-08-20 NOTE — ED Triage Notes (Signed)
Pt here today for wound recheck and also f/u in regards to possible reaction to medicine itself. Rash located on toes, underarms and lower abd. Pain 6/10 ?

## 2021-08-21 LAB — CBC WITH DIFFERENTIAL/PLATELET
Absolute Monocytes: 510 cells/uL (ref 200–950)
Basophils Absolute: 42 cells/uL (ref 0–200)
Basophils Relative: 0.7 %
Eosinophils Absolute: 288 cells/uL (ref 15–500)
Eosinophils Relative: 4.8 %
HCT: 40.1 % (ref 38.5–50.0)
Hemoglobin: 14.8 g/dL (ref 13.2–17.1)
Lymphs Abs: 774 cells/uL — ABNORMAL LOW (ref 850–3900)
MCH: 33 pg (ref 27.0–33.0)
MCHC: 36.9 g/dL — ABNORMAL HIGH (ref 32.0–36.0)
MCV: 89.3 fL (ref 80.0–100.0)
MPV: 11.5 fL (ref 7.5–12.5)
Monocytes Relative: 8.5 %
Neutro Abs: 4386 cells/uL (ref 1500–7800)
Neutrophils Relative %: 73.1 %
Platelets: 67 10*3/uL — ABNORMAL LOW (ref 140–400)
RBC: 4.49 10*6/uL (ref 4.20–5.80)
RDW: 15.1 % — ABNORMAL HIGH (ref 11.0–15.0)
Total Lymphocyte: 12.9 %
WBC: 6 10*3/uL (ref 3.8–10.8)

## 2023-11-09 ENCOUNTER — Ambulatory Visit: Admission: EM | Admit: 2023-11-09 | Discharge: 2023-11-09 | Disposition: A | Discharge status: Home / Self Care

## 2023-11-09 DIAGNOSIS — H6692 Otitis media, unspecified, left ear: Secondary | ICD-10-CM | POA: Diagnosis not present

## 2023-11-09 DIAGNOSIS — H9202 Otalgia, left ear: Secondary | ICD-10-CM

## 2023-11-09 MED ORDER — PREDNISONE 20 MG PO TABS
ORAL_TABLET | ORAL | 0 refills | Status: AC
Start: 2023-11-09 — End: ?

## 2023-11-09 MED ORDER — AMOXICILLIN-POT CLAVULANATE 875-125 MG PO TABS
1.0000 | ORAL_TABLET | Freq: Two times a day (BID) | ORAL | 0 refills | Status: AC
Start: 2023-11-09 — End: 2023-11-19

## 2023-11-09 NOTE — ED Triage Notes (Signed)
 Pt states that he has left ear pain. X1 week Pt states that the ear pain is causing him to have a headache.

## 2023-11-09 NOTE — ED Provider Notes (Signed)
 Jackson Fisher    CSN: [id number] Arrival date & time: 11/09/23  1407      History   Chief Complaint Chief Complaint  Patient presents with   Otalgia    HPI Jackson Fisher is a 48 y.o. male.   HPI 48 year old male presents with ear pain.  PMH significant for morbid obesity, recovering alcoholic in remission, Barrett's esophagus, and HTN.  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 No pertinent surgical history.     Home Medications    Prior to Admission medications   Medication Sig Start Date End Date Taking? Authorizing Provider  amoxicillin -clavulanate (AUGMENTIN ) 875-125 MG tablet Take 1 tablet by mouth 2 (two) times daily for 10 days. 11/09/23 11/19/23 Yes Teddy Sharper, FNP  ascorbic acid (VITAMIN C) 500 MG tablet Take 500 mg by mouth. 11/27/15  Yes [provider]  lansoprazole (PREVACID) 30 MG capsule Take 30 mg by mouth daily at 12 noon.   Yes [provider]  nadolol (CORGARD) 40 MG tablet  02/17/18  Yes [provider]  predniSONE  (DELTASONE ) 20 MG tablet Take 3 tabs PO daily x 5 days. 11/09/23  Yes Teddy Sharper, FNP  pyridOXINE (VITAMIN B-6) 100 MG tablet Take 100 mg by mouth daily.   Yes [provider]  thiamine  (VITAMIN B-1) 100 MG tablet Take 100 mg by mouth daily.   Yes [provider]  mupirocin  ointment (BACTROBAN ) 2 % Apply 1 application. topically 3 (three)  times daily. 08/20/21   Lowella Benton CROME, PA    Family History Family History  Problem Relation Age of Onset   Diabetes Father    Breast cancer Mother     Social History Social History   Tobacco Use   Smoking status: Never   Smokeless tobacco: Never  Vaping Use   Vaping status: Never Used  Substance Use Topics   Alcohol use: No    Comment: Quit 10/29/15   Drug use: No     Allergies   Ambien [zolpidem tartrate]   Review of Systems Review of Systems   Physical Exam Triage Vital Signs ED Triage Vitals  Encounter Vitals Group     BP      Girls Systolic BP Percentile      Girls Diastolic BP Percentile      Boys Systolic BP Percentile      Boys Diastolic BP Percentile      Pulse      Resp      Temp      Temp src      SpO2      Weight      Height      Head Circumference      Peak Flow      Pain Score      Pain Loc      Pain Education  Exclude from Growth Chart    No data found.  Updated Vital Signs BP 122/77 (BP Location: Right Arm)   Pulse (!) 52   Temp 99 F (37.2 C) (Oral)   Resp 17   Ht 6' 1 (1.854 m)   Wt 240 lb (108.9 kg)   SpO2 94%   BMI 31.66 kg/m    Physical Exam Vitals and nursing note reviewed.  Constitutional:      Appearance: Normal appearance. He is obese.  HENT:     Head: Normocephalic and atraumatic.     Right Ear: Tympanic membrane and external ear normal.     Left Ear: External ear normal.     Ears:     Comments: Moderate eustachian tube dysfunction noted bilaterally; Left TM: Erythematous, bulging,     Mouth/Throat:     Mouth: Mucous membranes are moist.     Pharynx: Oropharynx is clear.  Eyes:     Extraocular Movements: Extraocular movements intact.     Conjunctiva/sclera: Conjunctivae normal.     Pupils: Pupils are equal, round, and reactive to light.  Cardiovascular:     Rate and Rhythm: Normal rate and regular rhythm.     Pulses: Normal pulses.     Heart sounds: Normal heart sounds.  Pulmonary:     Effort:  Pulmonary effort is normal.     Breath sounds: Normal breath sounds. No wheezing, rhonchi or rales.  Musculoskeletal:        General: Normal range of motion.     Cervical back: Normal range of motion and neck supple.  Skin:    General: Skin is warm and dry.  Neurological:     General: No focal deficit present.     Mental Status: He is alert and oriented to person, place, and time.      UC Treatments / Results  Labs (all labs ordered are listed, but only abnormal results are displayed) Labs Reviewed - No data to display  EKG   Radiology No results found.  Procedures Procedures (including critical Fisher time)  Medications Ordered in UC Medications - No data to display  Initial Impression / Assessment and Plan / UC Course  I have reviewed the triage vital signs and the nursing notes.  Pertinent labs & imaging results that were available during my Fisher of the patient were reviewed by me and considered in my medical decision making (see chart for details).     MDM: 1.  Acute left otitis media-Rx'd Augmentin  875/125 mg tablet: Take 1 tablet twice daily x 10 days; 2.  Acute otalgia, left-Rx'd prednisone  20 mg tablet: Take 3 tablets p.o. daily x 5 days. Advised patient to take medications as directed with food to completion.  Advised take prednisone  with first dose of Augmentin  for the next 5 of 10 days.  Encouraged to increase daily water intake to 64 ounces per day while taking these medications.  Advised if symptoms worsen and/or unresolved please follow-up with your PCP, ENT or here for further evaluation.  Final Clinical Impressions(s) / UC Diagnoses   Final diagnoses:  Acute otalgia, left  Acute left otitis media     Discharge Instructions      Advised patient to take medications as directed with food to completion.  Advised take prednisone  with first dose of Augmentin  for the next 5 of 10 days.  Encouraged to increase daily water intake to 64 ounces per day while  taking these medications.  Advised if symptoms worsen and/or unresolved please follow-up  with your PCP, ENT or here for further evaluation.     ED Prescriptions     Medication Sig Dispense Auth. Provider   amoxicillin -clavulanate (AUGMENTIN ) 875-125 MG tablet Take 1 tablet by mouth 2 (two) times daily for 10 days. 20 tablet Heatherly Stenner, FNP   predniSONE  (DELTASONE ) 20 MG tablet Take 3 tabs PO daily x 5 days. 15 tablet Drayce Tawil, FNP      PDMP not reviewed this encounter.   Teddy Sharper, FNP 11/09/23 1446

## 2023-11-09 NOTE — Discharge Instructions (Addendum)
 Advised patient to take medications as directed with food to completion.  Advised take prednisone  with first dose of Augmentin  for the next 5 of 10 days.  Encouraged to increase daily water intake to 64 ounces per day while taking these medications.  Advised if symptoms worsen and/or unresolved please follow-up with your PCP, ENT or here for further evaluation.
